# Patient Record
Sex: Female | Born: 2005 | Race: Black or African American | Hispanic: No | Marital: Single | Smoking: Never smoker
Health system: Southern US, Community
[De-identification: ages and names within clinical notes are randomized; demographics above are authoritative.]

## PROBLEM LIST (undated history)

## (undated) DIAGNOSIS — T7840XA Allergy, unspecified, initial encounter: Secondary | ICD-10-CM

## (undated) DIAGNOSIS — L309 Dermatitis, unspecified: Secondary | ICD-10-CM

## (undated) DIAGNOSIS — R062 Wheezing: Secondary | ICD-10-CM

## (undated) HISTORY — DX: Wheezing: R06.2

## (undated) HISTORY — DX: Dermatitis, unspecified: L30.9

## (undated) HISTORY — DX: Allergy, unspecified, initial encounter: T78.40XA

---
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
## 2024-01-18 ENCOUNTER — Ambulatory Visit: Payer: Medicaid Other | Admitting: Dermatology

## 2024-01-19 ENCOUNTER — Encounter: Payer: Self-pay | Admitting: Dermatology

## 2024-01-19 ENCOUNTER — Ambulatory Visit: Payer: Medicaid Other | Admitting: Dermatology

## 2024-01-19 VITALS — BP 116/75

## 2024-01-19 DIAGNOSIS — L309 Dermatitis, unspecified: Secondary | ICD-10-CM

## 2024-01-19 DIAGNOSIS — L209 Atopic dermatitis, unspecified: Secondary | ICD-10-CM | POA: Diagnosis not present

## 2024-01-19 DIAGNOSIS — L299 Pruritus, unspecified: Secondary | ICD-10-CM

## 2024-01-19 DIAGNOSIS — R21 Rash and other nonspecific skin eruption: Secondary | ICD-10-CM | POA: Diagnosis not present

## 2024-01-19 DIAGNOSIS — L810 Postinflammatory hyperpigmentation: Secondary | ICD-10-CM | POA: Diagnosis not present

## 2024-01-19 MED ORDER — TACROLIMUS 0.1 % EX OINT: TOPICAL_OINTMENT | Freq: Two times a day (BID) | CUTANEOUS | 8 refills | Status: AC

## 2024-01-19 MED ORDER — CLOBETASOL PROPIONATE 0.05 % EX OINT
1.0000 | TOPICAL_OINTMENT | Freq: Two times a day (BID) | CUTANEOUS | 8 refills | Status: AC
Start: 2024-01-19 — End: ?

## 2024-01-19 NOTE — Patient Instructions (Addendum)
 Date: Tue Jan 19 2024  Hello Monica Carter,  Thank you for visiting today. Here is a summary of the key instructions:  - Medications:   - Apply clobetasol  cream twice daily (morning and night) for 2 weeks   - After 2 weeks, switch to tacrolimus  cream twice daily for 2 weeks   - Alternate between clobetasol  and tacrolimus  every 2 weeks until skin is clear  - Skin Care:   - Use fragrance-free body wash, lotions, and laundry detergent   - Switch to free and clear laundry detergent and fabric softener   - Use Dove Sensitive liquid body wash   - Apply moisturizer (CeraVe or Eucerin Advanced Repair) right after showering   - Apply prescription creams first, then moisturizer on top  - Follow-up: Return for follow-up appointment in 3 months  Please reach out if you have any questions or concerns.  Warm regards,  Dr. Delon Lenis [facility]      Important Information   Due to recent changes in healthcare laws, you may see results of your pathology and/or laboratory studies on MyChart before the doctors have had a chance to review them. We understand that in some cases there may be results that are confusing or concerning to you. Please understand that not all results are received at the same time and often the doctors may need to interpret multiple results in order to provide you with the best plan of care or course of treatment. Therefore, we ask that you please give us  2 business days to thoroughly review all your results before contacting the office for clarification. Should we see a critical lab result, you will be contacted sooner.     If You Need Anything After Your Visit   If you have any questions or concerns for your doctor, please call our main line at [phone]. If no one answers, please leave a voicemail as directed and we will return your call as soon as possible. Messages left after 4 pm will be answered the following business day.    You may also send us  a message via  MyChart. We typically respond to MyChart messages within 1-2 business days.  For prescription refills, please ask your pharmacy to contact our office. Our fax number is [phone].  If you have an urgent issue when the clinic is closed that cannot wait until the next business day, you can page your doctor at the number below.     Please note that while we do our best to be available for urgent issues outside of office hours, we are not available 24/7.    If you have an urgent issue and are unable to reach us , you may choose to seek medical care at your doctor's office, retail clinic, urgent care center, or emergency room.   If you have a medical emergency, please immediately call 911 or go to the emergency department. In the event of inclement weather, please call our main line at [phone] for an update on the status of any delays or closures.  Dermatology Medication Tips: Please keep the boxes that topical medications come in in order to help keep track of the instructions about where and how to use these. Pharmacies typically print the medication instructions only on the boxes and not directly on the medication tubes.   If your medication is too expensive, please contact our office at [phone] or send us  a message through MyChart.    We are unable to tell what your co-pay for  medications will be in advance as this is different depending on your insurance coverage. However, we may be able to find a substitute medication at lower cost or fill out paperwork to get insurance to cover a needed medication.    If a prior authorization is required to get your medication covered by your insurance company, please allow us  1-2 business days to complete this process.   Drug prices often vary depending on where the prescription is filled and some pharmacies may offer cheaper prices.   The website www.goodrx.com contains coupons for medications through different pharmacies. The prices here do  not account for what the cost may be with help from insurance (it may be cheaper with your insurance), but the website can give you the price if you did not use any insurance.  - You can print the associated coupon and take it with your prescription to the pharmacy.  - You may also stop by our office during regular business hours and pick up a GoodRx coupon card.  - If you need your prescription sent electronically to a different pharmacy, notify our office through [facility] or by phone at [phone]

## 2024-01-19 NOTE — Progress Notes (Signed)
 New Patient Visit   Subjective  Monica Carter is a 18 y.o. female who presents for the following: Atopic Dermatitis (Eczema)  Patient states she has eczema located at the arms and lower legs that she would like to have examined. Patient reports the areas have been there for 1 year. She reports the areas are bothersome. Patient reports the areas can be extremely itchy. Patient rates irritation 10 out of 10. Patient reports she has not previously been treated for these areas. Patient denies Hx of bx. Patient reports family history of eczema. Patient reports she does use some products with fragrance.   The following portions of the chart were reviewed this encounter and updated as appropriate: medications, allergies, medical history  Review of Systems:  No other skin or systemic complaints except as noted in HPI or Assessment and Plan.  Objective  Well appearing patient in no apparent distress; mood and affect are within normal limits.  A full examination was performed including scalp, head, eyes, ears, nose, lips, neck, chest, axillae, abdomen, back, buttocks, bilateral upper extremities, bilateral lower extremities, hands, feet, fingers, toes, fingernails, and toenails. All findings within normal limits unless otherwise noted below.   Relevant exam findings are noted in the Assessment and Plan.                 Assessment & Plan   1. Eczema-like Rash, Pruritus and Post Inflammatory Hyperpigmentation  - Assessment: Patient presents with eczema-like rash persisting for the past year, with intermittent flares and constant mild presence. Physical examination reveals hyperpigmented, lichenified plaques on the arms. The rash is likely triggered by environmental factors, potentially including fragrances in personal care products. The chronic nature of the condition suggests this may be newly presenting chronic eczema, but definitive diagnosis will depend on treatment response and recurrence  patterns.    - Plan:    Prescribe topical medications:     - Clobetasol  (potent corticosteroid) for application twice daily for 2 weeks     - Tacrolimus  as an alternative treatment, to be used in 2-week cycles alternating with clobetasol  until clearance    Recommend lifestyle modifications:     - Eliminate fragrances from body wash, lotions, and laundry detergent     - Switch to free and clear laundry detergent and fabric softener     - Use Dove Sensitive liquid body wash     - Apply moisturizer (recommended: CeraVe or Eucerin Advanced Repair) immediately after showering    Provide samples of Eucerin moisturizer    Patient education:     - Apply medications immediately after showering, followed by moisturizer     - Continue treatment until skin is no longer itchy and smooth, noting that darkness may take longer to fade     - Do not treat residual darkness with prescribed medications     - Informed patient of potential side effects of clobetasol  including skin thinning, stretch marks, and unspecified nose problems  Follow-up in 3 months to assess treatment efficacy, consider adding lightening cream for residual hyperpigmentation if rash is stable and controlled, and evaluate need for alternative treatments if recurrence persists (non-steroid topical cream or injectable medication every 2 weeks).  ATOPIC DERMATITIS, UNSPECIFIED TYPE   Related Medications clobetasol  ointment (TEMOVATE ) 0.05 % Apply 1 Application topically 2 (two) times daily. Apply 2 times daily for the 2 weeks then STOP for 2 WEEKS,When taking a break for 2 weeks apply Tacrolimus  Continue to alternate 2 weeks on and 2 weeks off  tacrolimus  (PROTOPIC ) 0.1 % ointment Apply topically 2 (two) times daily. Apply 2 times daily for the 2 weeks then STOP for 2 WEEKS,When taking a break for 2 weeks apply Clobetasol  Continue to alternate 2 weeks on and 2 weeks off  Return in about 4 months (around 05/21/2024) for Eczema F/U.  I,  Jetta Ager, am acting as Neurosurgeon for [employer], DO.  Documentation: I have reviewed the above documentation for accuracy and completeness, and I agree with the above.  Delon Lenis, DO

## 2024-02-29 ENCOUNTER — Encounter: Payer: Self-pay | Admitting: Obstetrics and Gynecology

## 2024-02-29 ENCOUNTER — Ambulatory Visit: Admitting: Obstetrics and Gynecology

## 2024-02-29 VITALS — BP 111/71 | HR 67 | Ht 62.0 in | Wt 134.0 lb

## 2024-02-29 DIAGNOSIS — Z3041 Encounter for surveillance of contraceptive pills: Secondary | ICD-10-CM

## 2024-02-29 DIAGNOSIS — Z30017 Encounter for initial prescription of implantable subdermal contraceptive: Secondary | ICD-10-CM | POA: Diagnosis not present

## 2024-02-29 DIAGNOSIS — N946 Dysmenorrhea, unspecified: Secondary | ICD-10-CM

## 2024-02-29 DIAGNOSIS — Z975 Presence of (intrauterine) contraceptive device: Secondary | ICD-10-CM | POA: Insufficient documentation

## 2024-02-29 DIAGNOSIS — Z3202 Encounter for pregnancy test, result negative: Secondary | ICD-10-CM

## 2024-02-29 LAB — POCT URINE PREGNANCY: Preg Test, Ur: NEGATIVE

## 2024-02-29 MED ORDER — ETONOGESTREL 68 MG ~~LOC~~ IMPL
68.0000 mg | DRUG_IMPLANT | Freq: Once | SUBCUTANEOUS | Status: AC
  Administered 2024-02-29: 68 mg via SUBCUTANEOUS

## 2024-02-29 NOTE — Progress Notes (Signed)
 GYNECOLOGY VISIT  Patient name: Monica Carter MRN [id number]  Date of birth: [birth year] Chief Complaint:   Contraception  History:  Discussed the use of AI scribe software for clinical note transcription with the patient, who gave verbal consent to proceed.  History of Present Illness Monica Carter is a 18 year old female who presents for consultation regarding contraceptive options, specifically considering switching from an IUD to Nexplanon .  She is considering switching from an IUD to Nexplanon  for contraception. She finds the arm implant to be easier and less tedious than the IUD. Her mother advised her that the arm implant might be the better option. She is aware of potential differences in bleeding patterns between the two methods and has heard about possible side effects such as weight gain, although she understands that experiences can vary.  She had unprotected intercourse with a female partner on the Saturday prior to the visit.     The following portions of the patient's history were reviewed and updated as appropriate: allergies, current medications, past family history, past medical history, past social history, past surgical history and problem list.   Health Maintenance:   Last pap No results found for: DIAGPAP, HPVHIGH, ADEQPAP  Health Maintenance  Topic Date Due   Pneumococcal Vaccine (1 of 2 - PPSV23, PCV20, or PCV21) 10/13/2005   DTaP/Tdap/Td vaccine (6 - Tdap) 08/14/2015   Flu Shot  01/22/2024   COVID-19 Vaccine (1 - 2024-25 season) Never done   Chlamydia screening  06/11/2024   Hepatitis B Vaccine  Completed   HPV Vaccine  Completed   Hepatitis C Screening  Completed   HIV Screening  Completed   Meningitis B Vaccine  Completed      Review of Systems:  Pertinent items are noted in HPI. Comprehensive review of systems was otherwise negative.   Objective:  Physical Exam BP 111/71   Pulse 67   Ht 5' 2 (1.575 m)   Wt 134 lb (60.8 kg)   LMP  02/06/2024   BMI 24.51 kg/m    Physical Exam Vitals and nursing note reviewed. Exam conducted with a chaperone present.  Constitutional:      Appearance: Normal appearance.  HENT:     Head: Normocephalic and atraumatic.  Pulmonary:     Effort: Pulmonary effort is normal.  Skin:    General: Skin is warm and dry.  Neurological:     General: No focal deficit present.     Mental Status: She is alert.  Psychiatric:        Mood and Affect: Mood normal.        Behavior: Behavior normal.        Thought Content: Thought content normal.        Judgment: Judgment normal.      Nexplanon  Insertion Procedure Patient identified, informed consent performed, consent signed.   Patient does understand that irregular bleeding is a very common side effect of this medication. She was advised to have backup contraception for one week after placement. Pregnancy test in clinic today was negative.  Appropriate time out taken.  Patient's left arm was prepped and draped in the usual sterile fashion. The area of insertion was noted on the right arm.  Patient was prepped with alcohol swab and then injected with 3 ml of 1% lidocaine.  She was prepped with betadine, Nexplanon  removed from packaging,  Device confirmed in needle, then inserted full length of needle and withdrawn per handbook instructions. Nexplanon  was able to palpated in  the patient's arm; patient palpated the insert herself. There was minimal blood loss.  Patient insertion site covered with guaze and a pressure bandage to reduce any bruising.  The patient tolerated the procedure well and was given post procedure instructions.       Assessment & Plan:   Assessment & Plan Contraceptive management and counseling Opted for Nexplanon  implant due to ease of insertion and maternal advice. Discussed bleeding patterns, effectiveness, and weight gain. Explained insertion discomfort and importance of confirming non-pregnancy before placement. - s/p  Nexplanon  implant in the right arm. - Advise taking a pregnancy test in two weeks given recent unprotected intercourse; negative UPT today. - Instruct to use backup contraception for the first week after Nexplanon  placement.  Dysmenorrhea Nexplanon  may provide relief for dysmenorrhea, though primarily indicated for pregnancy prevention.    Carter Quarry, MD [facility], [facility]

## 2024-02-29 NOTE — Progress Notes (Signed)
 Pt is in office for [facility] - pt was on pill, haven't taken in 4 months.  LMP 02/06/24 Last intercourse- 02/27/24  Pt is currently interested in Nexplanon , would like to discuss.

## 2024-02-29 NOTE — Patient Instructions (Signed)
 Insertion Aftercare you just got NEXPLANON Here is some helpful information to keep you informed. And if you have any questions, please consult your doctor. 24 Hours wear your top bandage The compression bandage helps minimize bruising.  3-5 Days keep your implant site covered While the insertion site is healing, keep the area covered with a smaller bandage.

## 2024-03-01 ENCOUNTER — Telehealth: Payer: Self-pay

## 2024-03-01 NOTE — Telephone Encounter (Signed)
 Returned call to pt's mother (authorized on [place]), answered questions about nexplanon  insertion from yesterday.

## 2024-03-11 ENCOUNTER — Encounter: Payer: Self-pay | Admitting: *Deleted

## 2024-05-09 ENCOUNTER — Other Ambulatory Visit (HOSPITAL_COMMUNITY): Payer: Self-pay

## 2024-05-09 MED ORDER — FLUZONE 0.5 ML IM SUSY
0.5000 mL | PREFILLED_SYRINGE | Freq: Once | INTRAMUSCULAR | 0 refills | Status: AC
  Filled 2024-05-09: qty 0.5, 1d supply, fill #0

## 2024-05-30 ENCOUNTER — Ambulatory Visit: Admitting: Dermatology

## 2024-08-03 ENCOUNTER — Ambulatory Visit: Payer: Self-pay | Admitting: Obstetrics and Gynecology
